# Patient Record
Sex: Male | Born: 2000 | Race: White | Hispanic: No | Marital: Single
Health system: Southern US, Community
[De-identification: ages and names within clinical notes are randomized; demographics above are authoritative.]

---
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
## 2023-12-10 ENCOUNTER — Emergency Department (HOSPITAL_COMMUNITY): Payer: BC Managed Care – PPO

## 2023-12-10 ENCOUNTER — Encounter (HOSPITAL_COMMUNITY): Payer: Self-pay

## 2023-12-10 ENCOUNTER — Other Ambulatory Visit: Payer: Self-pay

## 2023-12-10 ENCOUNTER — Emergency Department (HOSPITAL_COMMUNITY)
Admission: EM | Admit: 2023-12-10 | Discharge: 2023-12-10 | Disposition: A | Discharge status: Home / Self Care | Payer: BC Managed Care – PPO | Attending: Emergency Medicine | Admitting: Emergency Medicine

## 2023-12-10 DIAGNOSIS — R0789 Other chest pain: Secondary | ICD-10-CM | POA: Diagnosis present

## 2023-12-10 DIAGNOSIS — R0602 Shortness of breath: Secondary | ICD-10-CM | POA: Insufficient documentation

## 2023-12-10 DIAGNOSIS — F419 Anxiety disorder, unspecified: Secondary | ICD-10-CM | POA: Insufficient documentation

## 2023-12-10 DIAGNOSIS — B349 Viral infection, unspecified: Secondary | ICD-10-CM

## 2023-12-10 LAB — BASIC METABOLIC PANEL
Anion gap: 8 (ref 5–15)
BUN: 18 mg/dL (ref 6–20)
CO2: 25 mmol/L (ref 22–32)
Calcium: 9.4 mg/dL (ref 8.9–10.3)
Chloride: 102 mmol/L (ref 98–111)
Creatinine, Ser: 0.88 mg/dL (ref 0.61–1.24)
GFR, Estimated: >60 mL/min (ref >60)
Glucose, Bld: 109 mg/dL — ABNORMAL HIGH (ref 70–99)
Potassium: 3 mmol/L — ABNORMAL LOW (ref 3.5–5.1)
Sodium: 135 mmol/L (ref 135–145)

## 2023-12-10 LAB — CBC
HCT: 41.7 % (ref 39.0–52.0)
Hemoglobin: 14.4 g/dL (ref 13.0–17.0)
MCH: 33.9 pg (ref 26.0–34.0)
MCHC: 34.5 g/dL (ref 30.0–36.0)
MCV: 98.1 fL (ref 80.0–100.0)
Platelets: 233 10*3/uL (ref 150–400)
RBC: 4.25 MIL/uL (ref 4.22–5.81)
RDW: 11.3 % — ABNORMAL LOW (ref 11.5–15.5)
WBC: 7.5 10*3/uL (ref 4.0–10.5)
nRBC: 0 % (ref 0.0–0.2)

## 2023-12-10 LAB — TROPONIN I (HIGH SENSITIVITY)
Troponin I (High Sensitivity): 11 ng/L (ref <18)
Troponin I (High Sensitivity): 18 ng/L — ABNORMAL HIGH (ref <18)

## 2023-12-10 LAB — D-DIMER, QUANTITATIVE: D-Dimer, Quant: <0.27 ug{FEU}/mL (ref 0.00–0.50)

## 2023-12-10 MED ORDER — POTASSIUM CHLORIDE CRYS ER 20 MEQ PO TBCR
40.0000 meq | EXTENDED_RELEASE_TABLET | Freq: Once | ORAL | Status: AC
  Administered 2023-12-10: 40 meq via ORAL
  Filled 2023-12-10: qty 2

## 2023-12-10 MED ORDER — FAMOTIDINE IN NACL 20-0.9 MG/50ML-% IV SOLN
20.0000 mg | INTRAVENOUS | Status: AC
  Administered 2023-12-10: 20 mg via INTRAVENOUS
  Filled 2023-12-10: qty 50

## 2023-12-10 MED ORDER — ALUM & MAG HYDROXIDE-SIMETH 200-200-20 MG/5ML PO SUSP
15.0000 mL | Freq: Once | ORAL | Status: AC
  Administered 2023-12-10: 15 mL via ORAL
  Filled 2023-12-10: qty 30

## 2023-12-10 MED ORDER — KETOROLAC TROMETHAMINE 15 MG/ML IJ SOLN
15.0000 mg | Freq: Once | INTRAMUSCULAR | Status: AC
  Administered 2023-12-10: 15 mg via INTRAVENOUS
  Filled 2023-12-10: qty 1

## 2023-12-10 MED ORDER — ACETAMINOPHEN 500 MG PO TABS
1000.0000 mg | ORAL_TABLET | Freq: Once | ORAL | Status: AC
  Administered 2023-12-10: 1000 mg via ORAL
  Filled 2023-12-10: qty 2

## 2023-12-10 NOTE — ED Provider Notes (Signed)
 [place] EMERGENCY DEPARTMENT AT [facility] Provider Note   CSN: [id number] Arrival date & time: 12/10/23  0458     History  Chief Complaint  Patient presents with   Chest Pain    Mike Todd is a 23 y.o. male with PMHx anxiety who presents to ED concerned for intermittent chest pain, dizziness, SOB, belching over the past 1 week. Patient stating that chest pain is worse with inspiration and states that he currently has chest pain. Chest pain is central and anterior. Patient stating that his dizziness is chronic for him and that his vision dims when he stands up and has been happening to him since he was a child. Patient stating that his doctors assessed his heart years ago and it appears that the dizzy spells are d/t patient being tall. Patient also requesting water and stating that he has not taken his anxiety medication today.  Denies fever, cough, nausea, vomiting, diarrhea, dysuria, hematuria, hematochezia. Denies rhinorrhea, congestion, cough.     Chest Pain      Home Medications Prior to Admission medications   Medication Sig Start Date End Date Taking? Authorizing Provider  ondansetron  (ZOFRAN  ODT) 4 MG disintegrating tablet Take 1 tablet (4 mg total) by mouth every 8 (eight) hours as needed for nausea or vomiting. 04/29/15   Eluterio Fallow, PA-C      Allergies    Patient has no known allergies.    Review of Systems   Review of Systems  Cardiovascular:  Positive for chest pain.    Physical Exam Updated Vital Signs BP (!) 123/50   Pulse 90   Temp (!) 100.6 F (38.1 C) (Oral)   Resp 18   SpO2 99%  Physical Exam Vitals and nursing note reviewed.  Constitutional:      General: He is not in acute distress.    Appearance: He is not ill-appearing, toxic-appearing or diaphoretic.  HENT:     Head: Normocephalic and atraumatic.     Mouth/Throat:     Mouth: Mucous membranes are moist.     Pharynx: No oropharyngeal exudate or posterior  oropharyngeal erythema.  Eyes:     General: No scleral icterus.       Right eye: No discharge.        Left eye: No discharge.     Conjunctiva/sclera: Conjunctivae normal.  Cardiovascular:     Rate and Rhythm: Normal rate and regular rhythm.     Pulses: Normal pulses.          Radial pulses are 2+ on the right side and 2+ on the left side.     Heart sounds: Normal heart sounds. No murmur heard.    Comments: +2 radial pulses are symmetric Pulmonary:     Effort: Pulmonary effort is normal. No respiratory distress.     Breath sounds: Normal breath sounds. No wheezing, rhonchi or rales.  Abdominal:     General: Bowel sounds are normal.     Palpations: Abdomen is soft. There is no mass.     Tenderness: There is no abdominal tenderness.  Musculoskeletal:     Right lower leg: No edema.     Left lower leg: No edema.  Skin:    General: Skin is warm and dry.     Findings: No rash.  Neurological:     General: No focal deficit present.     Mental Status: He is alert and oriented to person, place, and time. Mental status is at baseline.  Psychiatric:  Mood and Affect: Mood is anxious.     ED Results / Procedures / Treatments   Labs (all labs ordered are listed, but only abnormal results are displayed) Labs Reviewed  BASIC METABOLIC PANEL - Abnormal; Notable for the following components:      Result Value   Potassium 3.0 (*)    Glucose, Bld 109 (*)    All other components within normal limits  CBC - Abnormal; Notable for the following components:   RDW 11.3 (*)    All other components within normal limits  TROPONIN I (HIGH SENSITIVITY) - Abnormal; Notable for the following components:   Troponin I (High Sensitivity) 18 (*)    All other components within normal limits  D-DIMER, QUANTITATIVE  TROPONIN I (HIGH SENSITIVITY)    EKG EKG Interpretation Date/Time:  Monday December 10 2023 05:07:27 EST Ventricular Rate:  117 PR Interval:  135 QRS Duration:  90 QT  Interval:  304 QTC Calculation: 425 R Axis:   254  Text Interpretation: Sinus tachycardia Consider right atrial enlargement Right superior axis Confirmed by Laurice Coy [phone]) on 12/10/2023 7:55:50 AM  Radiology DG Chest 2 View Result Date: 12/10/2023 CLINICAL DATA:  Chest pain EXAM: CHEST - 2 VIEW COMPARISON:  No comparison studies available. FINDINGS: The lungs are clear without focal pneumonia, edema, pneumothorax or pleural effusion. The cardiopericardial silhouette is within normal limits for size. Lucency anterior to the sternum on the lateral view is compatible with the degree of patient rotation. No acute bony abnormality. Telemetry leads overlie the chest. IMPRESSION: No active cardiopulmonary disease. Electronically Signed   By: Camellia Candle M.D.   On: 12/10/2023 05:52    Procedures Procedures    Medications Ordered in ED Medications  potassium chloride  SA (KLOR-CON  M) CR tablet 40 mEq (40 mEq Oral Given 12/10/23 0843)  alum & mag hydroxide-simeth (MAALOX/MYLANTA) 200-200-20 MG/5ML suspension 15 mL (15 mLs Oral Given 12/10/23 0842)  famotidine  (PEPCID ) IVPB 20 mg premix (0 mg Intravenous Stopped 12/10/23 0912)  acetaminophen  (TYLENOL ) tablet 1,000 mg (1,000 mg Oral Given 12/10/23 0940)  ketorolac  (TORADOL ) 15 MG/ML injection 15 mg (15 mg Intravenous Given 12/10/23 9057)    ED Course/ Medical Decision Making/ A&P                                 Medical Decision Making Amount and/or Complexity of Data Reviewed Labs: ordered. Radiology: ordered.  Risk OTC drugs. Prescription drug management.   This patient presents to the ED for concern of chest pain, this involves an extensive number of treatment options, and is a complaint that carries with it a high risk of complications and morbidity.  The differential diagnosis includes acute coronary syndrome, congestive heart failure, pericarditis, pneumonia, pulmonary embolism, tension pneumothorax, esophageal rupture, aortic dissection,  cardiac tamponade, musculoskeletal   Co morbidities that complicate the patient evaluation  anxiety   Additional history obtained:  It does not appear the patient follows with PCP.  Will refer to community clinic.   Problem List / ED Course / Critical interventions / Medication management  Patient presents to ED concerned for intermittent chest pain, dizziness, SOB, belching x1 week.  Denies any other infectious symptoms.  Triage vitals showing that patient is initially tachycardic with 109 BPM which resolved without medical management.  Patient also developed a mild fever in the ED at 100.81F. Rest of physical exam reassuring.  Patient afebrile with stable vitals. Patient ambulatory in ED  without complaint. I Ordered, and personally interpreted labs.  CBC without leukocytosis or anemia.  BMP with mild hypokalemia at 3.0.  Troponin with mild elevation but still within normal limits.  D-dimer negative. The patient was maintained on a cardiac monitor.  I personally viewed and interpreted the EKG/cardiac monitored which showed an underlying rhythm of: Sinus tachycardia. I ordered imaging studies including chest xray to assess for process contributing to patient's symptoms. I independently visualized and interpreted imaging which showed no acute cardiopulmonary disease. I agree with the radiologist interpretation Provided patient with GI cocktail given his belching - this did not relieve his chest pain. With patient's mild fever in the ED but otherwise reassuring workup, I am more concerned for a brewing viral illness and possible costochondritis. Provided patient with a dose of tylenol  and Toradol . Educated patient on alternating Ibuprofen and Tylenol  for pain control. recommended following up with PCP.  Patient verbalized understanding of plan. I have reviewed the patients home medicines and have made adjustments as needed Patient was given return precautions. Patient stable for discharge at this  time.  Patient verbalized understanding of plan.  Ddx:  These are considered less likely due to history of present illness and physical exam findings.  -Acute coronary syndrome: EKG and troponins within normal limits  -Congestive heart failure: physical exam reassuring -Pneumonia: lungs are clear to auscultation bilaterally -Pericarditis: EKG reassuring -Pneumothorax: lungs are clear to auscultation bilaterally -Esophageal rupture: patient denies vomiting -Aortic dissection: vital signs are stable, no variation in pulse pressure, chest pain is anterior and sternal. -Cardiac tamponade: absence of hypotension, JVD, and muffled heart sounds   Social Determinants of Health:  none         Final Clinical Impression(s) / ED Diagnoses Final diagnoses:  Atypical chest pain  Viral illness    Rx / DC Orders ED Discharge Orders     None         Hoy Nidia FALCON, [place] 12/10/23 1016    Patsey Lot, MD 12/10/23 1523

## 2023-12-10 NOTE — ED Triage Notes (Signed)
 Patient arrived with complaints of central chest pain, dizziness, and shortness of breath over the last few days.

## 2023-12-10 NOTE — Discharge Instructions (Addendum)

## 2023-12-14 ENCOUNTER — Telehealth: Payer: Self-pay

## 2023-12-14 ENCOUNTER — Ambulatory Visit
Admission: RE | Admit: 2023-12-14 | Discharge: 2023-12-14 | Disposition: A | Discharge status: Home / Self Care | Payer: BC Managed Care – PPO | Source: Ambulatory Visit | Attending: Family Medicine | Admitting: Family Medicine

## 2023-12-14 VITALS — BP 124/77 | HR 96 | Temp 98.6°F | Resp 16

## 2023-12-14 DIAGNOSIS — J029 Acute pharyngitis, unspecified: Secondary | ICD-10-CM

## 2023-12-14 LAB — POCT MONO SCREEN (KUC): Mono, POC: NEGATIVE

## 2023-12-14 LAB — POCT RAPID STREP A (OFFICE): Rapid Strep A Screen: NEGATIVE

## 2023-12-14 MED ORDER — AMOXICILLIN 500 MG PO CAPS: 500.0000 mg | ORAL_CAPSULE | Freq: Two times a day (BID) | ORAL | 0 refills | Status: DC

## 2023-12-14 MED ORDER — AMOXICILLIN 500 MG PO CAPS
500.0000 mg | ORAL_CAPSULE | Freq: Two times a day (BID) | ORAL | 0 refills | Status: AC
Start: 2023-12-14 — End: 2023-12-24

## 2023-12-14 NOTE — ED Provider Notes (Signed)
 [facility]    CSN: [id number] Arrival date & time: 12/14/23  1428      History   Chief Complaint Chief Complaint  Patient presents with   Sore Throat    Extremely sore throat with red bumps on roof of mouth. I think I've got strep throat. - Entered by patient    HPI Mike Todd is a 23 y.o. male  presents for evaluation of URI symptoms for 7 days. Patient reports associated symptoms of ST, tactile fevers, and body aches. Denies N/V/D, cough, congestion, ear pain, SOB. Patient does not have a hx of asthma. Patient is not an active smoker.   Reports no sick contacts.  Patient was seen in the ER on January 6 for atypical chest pain with negative workup.  Was diagnosed with viral illness.  Pt has taken nothing OTC for symptoms. Pt has no other concerns at this time.    Sore Throat    History reviewed. No pertinent past medical history.  There are no active problems to display for this patient.   History reviewed. No pertinent surgical history.     Home Medications    Prior to Admission medications   Medication Sig Start Date End Date Taking? Authorizing Provider  amoxicillin  (AMOXIL ) 500 MG capsule Take 1 capsule (500 mg total) by mouth 2 (two) times daily for 10 days. 12/14/23 12/24/23 Yes Marnette Perkins, Jodi R, NP  ondansetron  (ZOFRAN  ODT) 4 MG disintegrating tablet Take 1 tablet (4 mg total) by mouth every 8 (eight) hours as needed for nausea or vomiting. 04/29/15   Eluterio Fallow, PA-C    Family History History reviewed. No pertinent family history.  Social History Social History   Tobacco Use   Smoking status: Unknown     Allergies   Patient has no known allergies.   Review of Systems Review of Systems  Constitutional:  Positive for fever.  HENT:  Positive for sore throat.   Musculoskeletal:  Positive for myalgias.     Physical Exam Triage Vital Signs ED Triage Vitals  Encounter Vitals Group     BP 12/14/23 1441 124/77     Systolic BP  Percentile --      Diastolic BP Percentile --      Pulse Rate 12/14/23 1441 96     Resp 12/14/23 1441 16     Temp 12/14/23 1441 98.6 F (37 C)     Temp Source 12/14/23 1441 Oral     SpO2 12/14/23 1441 97 %     Weight --      Height --      Head Circumference --      Peak Flow --      Pain Score 12/14/23 1439 0     Pain Loc --      Pain Education --      Exclude from Growth Chart --    No data found.  Updated Vital Signs BP 124/77 (BP Location: Right Arm)   Pulse 96   Temp 98.6 F (37 C) (Oral)   Resp 16   SpO2 97%   Visual Acuity Right Eye Distance:   Left Eye Distance:   Bilateral Distance:    Right Eye Near:   Left Eye Near:    Bilateral Near:     Physical Exam Vitals and nursing note reviewed.  Constitutional:      General: He is not in acute distress.    Appearance: Normal appearance. He is not ill-appearing or toxic-appearing.  HENT:  Head: Normocephalic and atraumatic.     Right Ear: Tympanic membrane and ear canal normal.     Left Ear: Tympanic membrane and ear canal normal.     Nose: No congestion or rhinorrhea.     Mouth/Throat:     Mouth: Mucous membranes are moist.     Pharynx: Oropharynx is clear. Uvula midline. Posterior oropharyngeal erythema present. No pharyngeal swelling, oropharyngeal exudate or uvula swelling.     Tonsils: No tonsillar exudate.     Comments: Petechia noted   Eyes:     Pupils: Pupils are equal, round, and reactive to light.  Cardiovascular:     Rate and Rhythm: Normal rate and regular rhythm.     Heart sounds: Normal heart sounds.  Pulmonary:     Effort: Pulmonary effort is normal.     Breath sounds: Normal breath sounds.  Musculoskeletal:     Cervical back: Normal range of motion and neck supple.  Lymphadenopathy:     Cervical: No cervical adenopathy.  Skin:    General: Skin is warm and dry.  Neurological:     General: No focal deficit present.     Mental Status: He is alert and oriented to person, place, and  time.  Psychiatric:        Mood and Affect: Mood normal.        Behavior: Behavior normal.      UC Treatments / Results  Labs (all labs ordered are listed, but only abnormal results are displayed) Labs Reviewed  CULTURE, GROUP A STREP [facility])  POCT RAPID STREP A (OFFICE)  POCT MONO SCREEN ([facility])    EKG   Radiology No results found.  Procedures Procedures (including critical care time)  Medications Ordered in UC Medications - No data to display  Initial Impression / Assessment and Plan / UC Course  I have reviewed the triage vital signs and the nursing notes.  Pertinent labs & imaging results that were available during my care of the patient were reviewed by me and considered in my medical decision making (see chart for details).     Neg rapid strep, will culture. Neg POCT mono. Given LOS will start amoxil . Salt water gargles and warm liquids. PCP follow up if symptoms do not improve. ER precautions reviewed.  Final Clinical Impressions(s) / UC Diagnoses   Final diagnoses:  Sore throat  Acute pharyngitis, unspecified etiology     Discharge Instructions      Start Amoxil  twice daily for 10 days. Salt water gargles and warm liquids. Lots of rest and fluids. Please follow up with your PCP if your symptoms do not improve. Please go to the ER for any worsening symptoms. I hope you feel better soon!     ED Prescriptions     Medication Sig Dispense Auth. Provider   amoxicillin  (AMOXIL ) 500 MG capsule Take 1 capsule (500 mg total) by mouth 2 (two) times daily for 10 days. 20 capsule Townsend Cudworth, Jodi R, NP      PDMP not reviewed this encounter.   Loreda Myla SAUNDERS, NP 12/14/23 [phone]

## 2023-12-14 NOTE — ED Triage Notes (Signed)
 Pt presents to UC for c/o sore throat x1 week.

## 2023-12-14 NOTE — Discharge Instructions (Signed)
 Start Amoxil twice daily for 10 days. Salt water gargles and warm liquids. Lots of rest and fluids. Please follow up with your PCP if your symptoms do not improve. Please go to the ER for any worsening symptoms. I hope you feel better soon!

## 2023-12-17 LAB — CULTURE, GROUP A STREP (THRC)

## 2024-10-18 ENCOUNTER — Other Ambulatory Visit: Payer: Self-pay

## 2024-10-18 ENCOUNTER — Encounter (HOSPITAL_COMMUNITY): Payer: Self-pay

## 2024-10-18 ENCOUNTER — Emergency Department (HOSPITAL_COMMUNITY)

## 2024-10-18 ENCOUNTER — Observation Stay (HOSPITAL_COMMUNITY)
Admission: EM | Admit: 2024-10-18 | Discharge: 2024-10-19 | Disposition: A | Attending: Emergency Medicine | Admitting: Emergency Medicine

## 2024-10-18 DIAGNOSIS — K353 Acute appendicitis with localized peritonitis, without perforation or gangrene: Secondary | ICD-10-CM | POA: Diagnosis not present

## 2024-10-18 DIAGNOSIS — R1031 Right lower quadrant pain: Secondary | ICD-10-CM | POA: Diagnosis present

## 2024-10-18 DIAGNOSIS — F17200 Nicotine dependence, unspecified, uncomplicated: Secondary | ICD-10-CM | POA: Insufficient documentation

## 2024-10-18 DIAGNOSIS — K3580 Unspecified acute appendicitis: Secondary | ICD-10-CM | POA: Diagnosis present

## 2024-10-18 LAB — URINALYSIS, ROUTINE W REFLEX MICROSCOPIC
Bilirubin Urine: NEGATIVE
Glucose, UA: NEGATIVE mg/dL
Hgb urine dipstick: NEGATIVE
Ketones, ur: NEGATIVE mg/dL
Leukocytes,Ua: NEGATIVE
Nitrite: NEGATIVE
Protein, ur: NEGATIVE mg/dL
Specific Gravity, Urine: 1.016 (ref 1.005–1.030)
pH: 6 (ref 5.0–8.0)

## 2024-10-18 LAB — COMPREHENSIVE METABOLIC PANEL WITH GFR
ALT: 15 U/L (ref 0–44)
AST: 20 U/L (ref 15–41)
Albumin: 4.7 g/dL (ref 3.5–5.0)
Alkaline Phosphatase: 66 U/L (ref 38–126)
Anion gap: 12 (ref 5–15)
BUN: 17 mg/dL (ref 6–20)
CO2: 25 mmol/L (ref 22–32)
Calcium: 10 mg/dL (ref 8.9–10.3)
Chloride: 102 mmol/L (ref 98–111)
Creatinine, Ser: 0.86 mg/dL (ref 0.61–1.24)
GFR, Estimated: >60 mL/min (ref >60)
Glucose, Bld: 129 mg/dL — ABNORMAL HIGH (ref 70–99)
Potassium: 3.4 mmol/L — ABNORMAL LOW (ref 3.5–5.1)
Sodium: 139 mmol/L (ref 135–145)
Total Bilirubin: 0.4 mg/dL (ref 0.0–1.2)
Total Protein: 7.7 g/dL (ref 6.5–8.1)

## 2024-10-18 LAB — LIPASE, BLOOD: Lipase: 23 U/L (ref 11–51)

## 2024-10-18 LAB — CBC
HCT: 39.2 % (ref 39.0–52.0)
Hemoglobin: 13.8 g/dL (ref 13.0–17.0)
MCH: 33.5 pg (ref 26.0–34.0)
MCHC: 35.2 g/dL (ref 30.0–36.0)
MCV: 95.1 fL (ref 80.0–100.0)
Platelets: 249 K/uL (ref 150–400)
RBC: 4.12 MIL/uL — ABNORMAL LOW (ref 4.22–5.81)
RDW: 11.5 % (ref 11.5–15.5)
WBC: 11 K/uL — ABNORMAL HIGH (ref 4.0–10.5)
nRBC: 0 % (ref 0.0–0.2)

## 2024-10-18 MED ORDER — IOHEXOL 300 MG/ML  SOLN
100.0000 mL | Freq: Once | INTRAMUSCULAR | Status: AC | PRN
  Administered 2024-10-18: 100 mL via INTRAVENOUS

## 2024-10-18 NOTE — ED Triage Notes (Signed)
 Pt reports RLQ abdominal pain onset two nights ago associated with nausea and vomiting yesterday.

## 2024-10-18 NOTE — ED Provider Notes (Signed)
 [place] EMERGENCY DEPARTMENT AT [facility] Provider Note   CSN: [id number] Arrival date & time: 10/18/24  1944     Patient presents with: Abdominal Pain   Mike Todd is a 23 y.o. male.  With no significant past medical history presents to the ED for abdominal pain.  Patient reports diffuse abdominal pain that woke him from sleep 2 nights ago.  Pain has since migrated to the right lower quadrant and has persisted.  Associated nausea with a couple episodes of vomiting yesterday.  No changes in bowel habits fevers chills or other complaints at this time.  No prior history of abdominal surgeries.    Abdominal Pain      Prior to Admission medications   Medication Sig Start Date End Date Taking? Authorizing Provider  ondansetron  (ZOFRAN  ODT) 4 MG disintegrating tablet Take 1 tablet (4 mg total) by mouth every 8 (eight) hours as needed for nausea or vomiting. 04/29/15   Dansie, William, PA-C    Allergies: Patient has no known allergies.    Review of Systems  Gastrointestinal:  Positive for abdominal pain.    Updated Vital Signs BP 134/77   Pulse (!) 103   Temp 98.4 F (36.9 C) (Oral)   Resp 16   Ht 6' 4 (1.93 m)   Wt 68 kg   SpO2 97%   BMI 18.26 kg/m   Physical Exam Vitals and nursing note reviewed.  HENT:     Head: Normocephalic and atraumatic.  Eyes:     Pupils: Pupils are equal, round, and reactive to light.  Cardiovascular:     Rate and Rhythm: Normal rate and regular rhythm.  Pulmonary:     Effort: Pulmonary effort is normal.     Breath sounds: Normal breath sounds.  Abdominal:     Palpations: Abdomen is soft.     Tenderness: There is abdominal tenderness in the right lower quadrant. There is no guarding or rebound. Positive signs include McBurney's sign.  Skin:    General: Skin is warm and dry.  Neurological:     Mental Status: He is alert.  Psychiatric:        Mood and Affect: Mood normal.     (all labs ordered are listed, but only  abnormal results are displayed) Labs Reviewed  COMPREHENSIVE METABOLIC PANEL WITH GFR - Abnormal; Notable for the following components:      Result Value   Potassium 3.4 (*)    Glucose, Bld 129 (*)    All other components within normal limits  CBC - Abnormal; Notable for the following components:   WBC 11.0 (*)    RBC 4.12 (*)    All other components within normal limits  LIPASE, BLOOD  URINALYSIS, ROUTINE W REFLEX MICROSCOPIC    EKG: None  Radiology: No results found.   Procedures   Medications Ordered in the ED  iohexol (OMNIPAQUE) 300 MG/ML solution 100 mL (has no administration in time range)    Clinical Course as of 10/18/24 2344  Sat Oct 18, 2024  2325 I, Ozell Marine DO, am transitioning care of this patient to the oncoming provider pending CT abdomen pelvis for rule out appendicitis, reevaluation and disposition [MP]    Clinical Course User Index [MP] Marine Ozell LABOR, DO                                 Medical Decision Making 23 year old male with history as  above presented to the ED for 2 days of abdominal pain initially generalized then migrating to the right lower quadrant.  Right lower quadrant tenderness McBurney point tenderness on exam.  Labs notable for leukocytosis.  Presentation most concerning for acute appendicitis will need CT abdomen pelvis to evaluate.  Pain well-controlled at this time  Amount and/or Complexity of Data Reviewed Labs: ordered. Radiology: ordered.  Risk Prescription drug management.        Final diagnoses:  Right lower quadrant pain    ED Discharge Orders     None          Pamella Ozell LABOR, DO 10/18/24 2344

## 2024-10-19 ENCOUNTER — Encounter (HOSPITAL_COMMUNITY): Payer: Self-pay | Admitting: Surgery

## 2024-10-19 ENCOUNTER — Encounter (HOSPITAL_COMMUNITY)
Admission: EM | Disposition: A | Discharge status: Home / Self Care | Payer: Self-pay | Source: Home / Self Care | Attending: Emergency Medicine

## 2024-10-19 ENCOUNTER — Observation Stay (HOSPITAL_COMMUNITY): Admitting: Anesthesiology

## 2024-10-19 DIAGNOSIS — K3580 Unspecified acute appendicitis: Secondary | ICD-10-CM | POA: Diagnosis present

## 2024-10-19 HISTORY — PX: LAPAROSCOPIC APPENDECTOMY: SHX408

## 2024-10-19 SURGERY — APPENDECTOMY, LAPAROSCOPIC
Anesthesia: General | Site: Abdomen

## 2024-10-19 MED ORDER — BUPIVACAINE-EPINEPHRINE (PF) 0.25% -1:200000 IJ SOLN
INTRAMUSCULAR | Status: DC | PRN
  Administered 2024-10-19: 10 mL

## 2024-10-19 MED ORDER — FENTANYL CITRATE (PF) 250 MCG/5ML IJ SOLN
INTRAMUSCULAR | Status: DC | PRN
  Administered 2024-10-19: 100 ug via INTRAVENOUS
  Administered 2024-10-19: 50 ug via INTRAVENOUS

## 2024-10-19 MED ORDER — ONDANSETRON HCL 4 MG/2ML IJ SOLN
INTRAMUSCULAR | Status: AC
  Filled 2024-10-19: qty 2

## 2024-10-19 MED ORDER — MIDAZOLAM HCL (PF) 2 MG/2ML IJ SOLN
INTRAMUSCULAR | Status: DC | PRN
  Administered 2024-10-19: 2 mg via INTRAVENOUS

## 2024-10-19 MED ORDER — KETOROLAC TROMETHAMINE 30 MG/ML IJ SOLN
INTRAMUSCULAR | Status: DC | PRN
  Administered 2024-10-19: 30 mg via INTRAVENOUS

## 2024-10-19 MED ORDER — CEFOTETAN DISODIUM 2 G IJ SOLR
INTRAMUSCULAR | Status: AC
  Filled 2024-10-19: qty 2

## 2024-10-19 MED ORDER — OXYCODONE HCL 5 MG/5ML PO SOLN: 5.0000 mg | Freq: Once | ORAL | Status: DC | PRN

## 2024-10-19 MED ORDER — MORPHINE SULFATE (PF) 4 MG/ML IV SOLN: 4.0000 mg | INTRAVENOUS | Status: DC | PRN

## 2024-10-19 MED ORDER — SODIUM CHLORIDE 0.9 % IV SOLN
2.0000 g | Freq: Once | INTRAVENOUS | Status: AC
  Administered 2024-10-19: 2 g via INTRAVENOUS
  Filled 2024-10-19: qty 2

## 2024-10-19 MED ORDER — DIPHENHYDRAMINE HCL 25 MG PO CAPS: 25.0000 mg | ORAL_CAPSULE | Freq: Four times a day (QID) | ORAL | Status: DC | PRN

## 2024-10-19 MED ORDER — MIDAZOLAM HCL (PF) 2 MG/2ML IJ SOLN: 0.5000 mg | Freq: Once | INTRAMUSCULAR | Status: DC | PRN

## 2024-10-19 MED ORDER — ACETAMINOPHEN 10 MG/ML IV SOLN
INTRAVENOUS | Status: AC
  Filled 2024-10-19: qty 100

## 2024-10-19 MED ORDER — SUGAMMADEX SODIUM 200 MG/2ML IV SOLN
INTRAVENOUS | Status: DC | PRN
Start: 2024-10-19 — End: 2024-10-19
  Administered 2024-10-19: 200 mg via INTRAVENOUS

## 2024-10-19 MED ORDER — BUPIVACAINE-EPINEPHRINE (PF) 0.25% -1:200000 IJ SOLN
INTRAMUSCULAR | Status: AC
  Filled 2024-10-19: qty 30

## 2024-10-19 MED ORDER — HYDROMORPHONE HCL 1 MG/ML IJ SOLN: 0.2500 mg | INTRAMUSCULAR | Status: DC | PRN

## 2024-10-19 MED ORDER — ACETAMINOPHEN 325 MG PO TABS
650.0000 mg | ORAL_TABLET | Freq: Four times a day (QID) | ORAL | Status: DC | PRN
Start: 2024-10-19 — End: 2024-10-19

## 2024-10-19 MED ORDER — ONDANSETRON HCL 4 MG/2ML IJ SOLN
INTRAMUSCULAR | Status: AC
Start: 2024-10-19 — End: 2024-10-19
  Filled 2024-10-19: qty 2

## 2024-10-19 MED ORDER — PROPOFOL 10 MG/ML IV BOLUS
INTRAVENOUS | Status: DC | PRN
Start: 2024-10-19 — End: 2024-10-19
  Administered 2024-10-19: 200 mg via INTRAVENOUS

## 2024-10-19 MED ORDER — OXYCODONE HCL 5 MG PO TABS: 5.0000 mg | ORAL_TABLET | Freq: Once | ORAL | Status: DC | PRN

## 2024-10-19 MED ORDER — LACTATED RINGERS IR SOLN
Status: DC | PRN
  Administered 2024-10-19: 1000 mL

## 2024-10-19 MED ORDER — DIPHENHYDRAMINE HCL 50 MG/ML IJ SOLN: 25.0000 mg | Freq: Four times a day (QID) | INTRAMUSCULAR | Status: DC | PRN

## 2024-10-19 MED ORDER — PIPERACILLIN-TAZOBACTAM 3.375 G IVPB 30 MIN
3.3750 g | Freq: Once | INTRAVENOUS | Status: AC
  Administered 2024-10-19: 3.375 g via INTRAVENOUS
  Filled 2024-10-19: qty 50

## 2024-10-19 MED ORDER — ONDANSETRON HCL 4 MG/2ML IJ SOLN: 4.0000 mg | Freq: Four times a day (QID) | INTRAMUSCULAR | Status: DC | PRN

## 2024-10-19 MED ORDER — OXYCODONE HCL 5 MG PO TABS: 5.0000 mg | ORAL_TABLET | Freq: Four times a day (QID) | ORAL | 0 refills | Status: AC | PRN

## 2024-10-19 MED ORDER — ONDANSETRON 4 MG PO TBDP: 4.0000 mg | ORAL_TABLET | Freq: Four times a day (QID) | ORAL | Status: DC | PRN

## 2024-10-19 MED ORDER — MEPERIDINE HCL 25 MG/ML IJ SOLN: 6.2500 mg | INTRAMUSCULAR | Status: DC | PRN

## 2024-10-19 MED ORDER — ACETAMINOPHEN 10 MG/ML IV SOLN
INTRAVENOUS | Status: DC | PRN
  Administered 2024-10-19: 1000 mg via INTRAVENOUS

## 2024-10-19 MED ORDER — SUCCINYLCHOLINE CHLORIDE 200 MG/10ML IV SOSY
PREFILLED_SYRINGE | INTRAVENOUS | Status: AC
  Filled 2024-10-19: qty 10

## 2024-10-19 MED ORDER — FENTANYL CITRATE (PF) 250 MCG/5ML IJ SOLN
INTRAMUSCULAR | Status: AC
  Filled 2024-10-19: qty 5

## 2024-10-19 MED ORDER — ROCURONIUM BROMIDE 10 MG/ML (PF) SYRINGE
PREFILLED_SYRINGE | INTRAVENOUS | Status: DC | PRN
  Administered 2024-10-19: 50 mg via INTRAVENOUS

## 2024-10-19 MED ORDER — SUCCINYLCHOLINE CHLORIDE 200 MG/10ML IV SOSY
PREFILLED_SYRINGE | INTRAVENOUS | Status: DC | PRN
Start: 2024-10-19 — End: 2024-10-19
  Administered 2024-10-19: 160 mg via INTRAVENOUS

## 2024-10-19 MED ORDER — DEXAMETHASONE SOD PHOSPHATE PF 10 MG/ML IJ SOLN
INTRAMUSCULAR | Status: DC | PRN
  Administered 2024-10-19: 10 mg via INTRAVENOUS

## 2024-10-19 MED ORDER — ONDANSETRON HCL 4 MG/2ML IJ SOLN
INTRAMUSCULAR | Status: DC | PRN
  Administered 2024-10-19: 4 mg via INTRAVENOUS

## 2024-10-19 MED ORDER — LIDOCAINE HCL (PF) 2 % IJ SOLN
INTRAMUSCULAR | Status: AC
  Filled 2024-10-19: qty 5

## 2024-10-19 MED ORDER — PROPOFOL 10 MG/ML IV BOLUS
INTRAVENOUS | Status: AC
Start: 2024-10-19 — End: 2024-10-19
  Filled 2024-10-19: qty 20

## 2024-10-19 MED ORDER — MIDAZOLAM HCL 2 MG/2ML IJ SOLN
INTRAMUSCULAR | Status: AC
  Filled 2024-10-19: qty 2

## 2024-10-19 MED ORDER — OXYCODONE HCL 5 MG PO TABS
5.0000 mg | ORAL_TABLET | ORAL | Status: DC | PRN
  Administered 2024-10-19: 5 mg via ORAL
  Filled 2024-10-19: qty 1

## 2024-10-19 MED ORDER — KETOROLAC TROMETHAMINE 30 MG/ML IJ SOLN
INTRAMUSCULAR | Status: AC
  Filled 2024-10-19: qty 1

## 2024-10-19 MED ORDER — LIDOCAINE HCL (CARDIAC) PF 100 MG/5ML IV SOSY
PREFILLED_SYRINGE | INTRAVENOUS | Status: DC | PRN
Start: 2024-10-19 — End: 2024-10-19
  Administered 2024-10-19: 60 mg via INTRAVENOUS

## 2024-10-19 MED ORDER — LACTATED RINGERS IV SOLN: INTRAVENOUS | Status: DC

## 2024-10-19 MED ORDER — ROCURONIUM BROMIDE 10 MG/ML (PF) SYRINGE
PREFILLED_SYRINGE | INTRAVENOUS | Status: AC
  Filled 2024-10-19: qty 10

## 2024-10-19 SURGICAL SUPPLY — 37 items
BAG COUNTER SPONGE SURGICOUNT (BAG) IMPLANT
BENZOIN TINCTURE PRP APPL 2/3 (GAUZE/BANDAGES/DRESSINGS) IMPLANT
CLIP APPLIE 5 13 M/L LIGAMAX5 (MISCELLANEOUS) IMPLANT
CLIP APPLIE ROT 10 11.4 M/L (STAPLE) IMPLANT
CLSR STERI-STRIP ANTIMIC 1/2X4 (GAUZE/BANDAGES/DRESSINGS) IMPLANT
COVER SURGICAL LIGHT HANDLE (MISCELLANEOUS) ×1 IMPLANT
CUTTER ECHEON FLEX ENDO 45 340 (ENDOMECHANICALS) IMPLANT
DRSG TEGADERM 2-3/8X2-3/4 SM (GAUZE/BANDAGES/DRESSINGS) IMPLANT
ELECT REM PT RETURN 15FT ADLT (MISCELLANEOUS) ×1 IMPLANT
GAUZE SPONGE 2X2 8PLY STRL LF (GAUZE/BANDAGES/DRESSINGS) IMPLANT
GLOVE BIO SURGEON STRL SZ7 (GLOVE) ×1 IMPLANT
GLOVE BIOGEL PI IND STRL 7.0 (GLOVE) ×1 IMPLANT
GLOVE BIOGEL PI IND STRL 7.5 (GLOVE) ×1 IMPLANT
GOWN STRL REUS W/ TWL LRG LVL3 (GOWN DISPOSABLE) ×2 IMPLANT
IRRIGATION SUCT STRKRFLW 2 WTP (MISCELLANEOUS) ×1 IMPLANT
KIT BASIN OR (CUSTOM PROCEDURE TRAY) ×1 IMPLANT
KIT TURNOVER KIT A (KITS) ×1 IMPLANT
NS IRRIG 1000ML POUR BTL (IV SOLUTION) ×1 IMPLANT
PENCIL SMOKE EVACUATOR (MISCELLANEOUS) IMPLANT
RELOAD STAPLE 45 2.6 WHT THIN (STAPLE) IMPLANT
RELOAD STAPLE 45 3.6 BLU REG (STAPLE) IMPLANT
SCISSORS LAP 5X35 DISP (ENDOMECHANICALS) ×1 IMPLANT
SET TUBE SMOKE EVAC HIGH FLOW (TUBING) ×1 IMPLANT
SHEARS HARMONIC 36 ACE (MISCELLANEOUS) IMPLANT
SLEEVE ADV FIXATION 5X100MM (TROCAR) IMPLANT
SLEEVE Z-THREAD 5X100MM (TROCAR) ×1 IMPLANT
SPIKE FLUID TRANSFER (MISCELLANEOUS) ×1 IMPLANT
STRIP CLOSURE SKIN 1/2X4 (GAUZE/BANDAGES/DRESSINGS) ×1 IMPLANT
SUT MNCRL AB 4-0 PS2 18 (SUTURE) ×1 IMPLANT
SUT VICRYL 0 ENDOLOOP (SUTURE) IMPLANT
SUT VICRYL 0 UR6 27IN ABS (SUTURE) ×1 IMPLANT
SYSTEM BAG RETRIEVAL 10MM (BASKET) ×1 IMPLANT
TOWEL OR DSP ST BLU DLX 10/PK (DISPOSABLE) ×1 IMPLANT
TRAY FOLEY MTR SLVR 16FR STAT (SET/KITS/TRAYS/PACK) IMPLANT
TRAY LAPAROSCOPIC (CUSTOM PROCEDURE TRAY) ×1 IMPLANT
TROCAR BALLN 12MMX100 BLUNT (TROCAR) ×1 IMPLANT
TROCAR Z-THREAD OPTICAL 5X100M (TROCAR) ×1 IMPLANT

## 2024-10-19 NOTE — Anesthesia Preprocedure Evaluation (Addendum)
 Anesthesia Evaluation  Patient identified by MRN, date of birth, ID band Patient awake    Reviewed: Allergy & Precautions, NPO status , Patient's Chart, lab work & pertinent test results  History of Anesthesia Complications Negative for: history of anesthetic complications  Airway Mallampati: I  TM Distance: >3 FB Neck ROM: Full    Dental  (+) Dental Advisory Given, Teeth Intact   Pulmonary Current Smoker and Patient abstained from smoking.   breath sounds clear to auscultation       Cardiovascular negative cardio ROS  Rhythm:Regular Rate:Normal     Neuro/Psych negative neurological ROS     GI/Hepatic Neg liver ROS,,,N/V with acute appy   Endo/Other  negative endocrine ROS    Renal/GU negative Renal ROS     Musculoskeletal   Abdominal   Peds  Hematology Hb 13.8, plt 249k   Anesthesia Other Findings   Reproductive/Obstetrics                              Anesthesia Physical Anesthesia Plan  ASA: 2  Anesthesia Plan: General   Post-op Pain Management: Ofirmev  IV (intra-op)*   Induction: Intravenous and Rapid sequence  PONV Risk Score and Plan: 2 and Ondansetron  and Dexamethasone  Airway Management Planned: Oral ETT  Additional Equipment: None  Intra-op Plan:   Post-operative Plan: Extubation in OR  Informed Consent: I have reviewed the patients History and Physical, chart, labs and discussed the procedure including the risks, benefits and alternatives for the proposed anesthesia with the patient or authorized representative who has indicated his/her understanding and acceptance.     Dental advisory given  Plan Discussed with: CRNA and Surgeon  Anesthesia Plan Comments:          Anesthesia Quick Evaluation

## 2024-10-19 NOTE — Transfer of Care (Signed)
 Immediate Anesthesia Transfer of Care Note  Patient: Mike Todd  Procedure(s) Performed: APPENDECTOMY, LAPAROSCOPIC (Abdomen)  Patient Location: PACU  Anesthesia Type:General  Level of Consciousness: awake, alert , oriented, and patient cooperative  Airway & Oxygen Therapy: Patient Spontanous Breathing and Patient connected to face mask oxygen  Post-op Assessment: Report given to RN and Post -op Vital signs reviewed and stable  Post vital signs: Reviewed and stable  Last Vitals:  Vitals Value Taken Time  BP 116/62 10/19/24 11:03  Temp 36.6 C 10/19/24 11:03  Pulse 92 10/19/24 11:04  Resp 12 10/19/24 11:04  SpO2 100 % 10/19/24 11:04  Vitals shown include unfiled device data.  Last Pain:  Vitals:   10/19/24 1103  TempSrc:   PainSc: 0-No pain         Complications: No notable events documented.

## 2024-10-19 NOTE — Progress Notes (Signed)
Nurse reviewed discharge instructions with pt.  Pt verbalized understanding of discharge instructions, follow up appointments and new medications.  No concerns at time of discharge. 

## 2024-10-19 NOTE — Discharge Instructions (Signed)
 [facility] ______[facility] LAPAROSCOPIC SURGERY: POST OP INSTRUCTIONS Always review your discharge instruction sheet given to you by the facility where your surgery was performed. IF YOU HAVE DISABILITY OR FAMILY LEAVE FORMS, YOU MUST BRING THEM TO THE OFFICE FOR PROCESSING.   DO NOT GIVE THEM TO YOUR DOCTOR.  A prescription for pain medication may be given to you upon discharge.  Take your pain medication as prescribed, if needed.  If narcotic pain medicine is not needed, then you may take acetaminophen  (Tylenol ) or ibuprofen  (Advil ) as needed. Take your usually prescribed medications unless otherwise directed. If you need a refill on your pain medication, please contact your pharmacy.  They will contact our office to request authorization. Prescriptions will not be filled after 5pm or on week-ends. You should follow a light diet the first few days after arrival home, such as soup and crackers, etc.  Be sure to include lots of fluids daily. Most patients will experience some swelling and bruising in the area of the incisions.  Ice packs will help.  Swelling and bruising can take several days to resolve.  It is common to experience some constipation if taking pain medication after surgery.  Increasing fluid intake and taking a stool softener (such as Colace) will usually help or prevent this problem from occurring.  A mild laxative (Milk of Magnesia or Miralax) should be taken according to package instructions if there are no bowel movements after 48 hours. Unless discharge instructions indicate otherwise, you may remove your bandages 24-48 hours after surgery, and you may shower at that time.  You may have steri-strips (small skin tapes) in place directly over the incision.  These strips should be left on the skin for 7-10 days.  If your surgeon used skin glue on the incision, you may shower in 24 hours.  The glue will flake off over the next 2-3 weeks.  Any sutures or staples will be  removed at the office during your follow-up visit. ACTIVITIES:  You may resume regular (light) daily activities beginning the next day--such as daily self-care, walking, climbing stairs--gradually increasing activities as tolerated.  You may have sexual intercourse when it is comfortable.  Refrain from any heavy lifting or straining until approved by your doctor. You may drive when you are no longer taking prescription pain medication, you can comfortably wear a seatbelt, and you can safely maneuver your car and apply brakes. RETURN TO WORK:  __________________________________________________________ Mike Todd should see your doctor in the office for a follow-up appointment approximately 2-3 weeks after your surgery.  Make sure that you call for this appointment within a day or two after you arrive home to insure a convenient appointment time. OTHER INSTRUCTIONS: __________________________________________________________________________________________________________________________ __________________________________________________________________________________________________________________________ WHEN TO CALL YOUR DOCTOR: Fever over 101.0 Inability to urinate Continued bleeding from incision. Increased pain, redness, or drainage from the incision. Increasing abdominal pain  The clinic staff is available to answer your questions during regular business hours.  Please don't hesitate to call and ask to speak to one of the nurses for clinical concerns.  If you have a medical emergency, go to the nearest emergency room or call 911.  A surgeon from [facility] is always on call at the hospital. [address], [address], [place], [place]  [address] ? [address] [address], [place], [place]   [address] [phone] ? [phone] ? FAX [phone] Web site: [email or web address]

## 2024-10-19 NOTE — Anesthesia Procedure Notes (Signed)
 Procedure Name: Intubation Date/Time: 10/19/2024 10:16 AM  Performed by: Dasie Nena PARAS, CRNAPre-anesthesia Checklist: Patient identified, Emergency Drugs available, Suction available, Patient being monitored and Timeout performed Patient Re-evaluated:Patient Re-evaluated prior to induction Oxygen Delivery Method: Circle system utilized Preoxygenation: Pre-oxygenation with 100% oxygen Induction Type: IV induction, Rapid sequence and Cricoid Pressure applied Laryngoscope Size: Miller and 3 Grade View: Grade I Tube type: Oral Tube size: 7.5 mm Number of attempts: 1 Airway Equipment and Method: Stylet Placement Confirmation: ETT inserted through vocal cords under direct vision, positive ETCO2 and breath sounds checked- equal and bilateral Secured at: 24 cm Tube secured with: Tape Dental Injury: Teeth and Oropharynx as per pre-operative assessment

## 2024-10-19 NOTE — Op Note (Signed)
 Appendectomy, Lap, Procedure Note  Indications: The patient presented with a history of right-sided abdominal pain. A CT scan revealed findings consistent with acute appendicitis.  Pre-operative Diagnosis: Acute appendicitis without mention of peritonitis  Post-operative Diagnosis: Same  Surgeon: Donnice MARLA Lima   Assistants: none  Anesthesia: General endotracheal anesthesia  ASA Class: 1  Procedure Details  The patient was seen again in the Holding Room. The risks, benefits, complications, treatment options, and expected outcomes were discussed with the patient and/or family. The possibilities of reaction to medication, perforation of viscus, bleeding, recurrent infection, finding a normal appendix, the need for additional procedures, failure to diagnose a condition, and creating a complication requiring transfusion or operation were discussed. There was concurrence with the proposed plan and informed consent was obtained. The site of surgery was properly noted. The patient was taken to Operating Room, identified as Mike Todd and the procedure verified as Appendectomy. A Time Out was held and the above information confirmed.  The patient was placed in the supine position and general anesthesia was induced.  The abdomen was prepped and draped in a sterile fashion. A one centimeter supraumbilical incision was made.  Dissection was carried down to the fascia bluntly.  The fascia was incised vertically.  We entered the peritoneal cavity bluntly.  A pursestring suture was passed around the fascial opening with a 0 Vicryl.  The Hasson cannula was introduced into the abdomen and the tails of the suture were used to hold the Hasson in place.   The pneumoperitoneum was then established maintaining a maximum pressure of 15 mmHg.  Additional 5 mm cannulas then placed in the left lower quadrant of the abdomen and the epigastrium under direct visualization. A careful evaluation of the entire abdomen  was carried out. The patient was placed in Trendelenburg and left lateral decubitus position.  The scope was moved to the right upper quadrant port site. The cecum was mobilized medially.  The appendix was in a retrocecal location.  It appeared inflamed, but there was no sign of perforation or abscess. The appendix was carefully dissected. The appendix was skeletonized with the harmonic scalpel.   The appendix was divided at its base using an endo-GIA stapler. No appendiceal stump was left in place. There was no evidence of bleeding, leakage, or complication after division of the appendix. Irrigation was also performed and irrigate suctioned from the abdomen as well.  The umbilical port site was closed with the purse string suture. There was no residual palpable fascial defect.  The trocar site skin wounds were closed with 4-0 Monocryl.  Instrument, sponge, and needle counts were correct at the conclusion of the case.   Findings: The appendix was found to be inflamed. There were not signs of necrosis.  There was not perforation. There was not abscess formation.  Estimated Blood Loss:  Minimal         Drains: none         Specimens: appendix         Complications:  None; patient tolerated the procedure well.         Disposition: PACU - hemodynamically stable.         Condition: stable  Donnice MARLA. Lima, MD, [facility]  General Surgery   10/19/2024 10:55 AM

## 2024-10-19 NOTE — Plan of Care (Signed)
 ?  Problem: Clinical Measurements: ?Goal: Will remain free from infection ?Outcome: Progressing ?  ?

## 2024-10-19 NOTE — Progress Notes (Signed)
   10/19/24 1222  TOC Brief Assessment  Insurance and Status Reviewed  Patient has primary care physician No  Home environment has been reviewed Apartment  Prior level of function: Independent  Prior/Current Home Services No current home services  Social Drivers of Health Review SDOH reviewed no interventions necessary  Readmission risk has been reviewed Yes  Transition of care needs no transition of care needs at this time    Signed: Heather Saltness, MSW, LCSW Clinical Social Worker Inpatient Care Management 10/19/2024 12:23 PM

## 2024-10-19 NOTE — Plan of Care (Signed)
   Problem: Education: Goal: Knowledge of General Education information will improve Description: Including pain rating scale, medication(s)/side effects and non-pharmacologic comfort measures Outcome: Progressing   Problem: Health Behavior/Discharge Planning: Goal: Ability to manage health-related needs will improve Outcome: Progressing   Problem: Activity: Goal: Risk for activity intolerance will decrease Outcome: Progressing

## 2024-10-19 NOTE — Anesthesia Postprocedure Evaluation (Signed)
 Anesthesia Post Note  Patient: Mike Todd  Procedure(s) Performed: APPENDECTOMY, LAPAROSCOPIC (Abdomen)     Patient location during evaluation: PACU Anesthesia Type: General Level of consciousness: awake and alert, patient cooperative and oriented Pain management: pain level controlled Vital Signs Assessment: post-procedure vital signs reviewed and stable Respiratory status: spontaneous breathing, nonlabored ventilation and respiratory function stable Cardiovascular status: blood pressure returned to baseline and stable Postop Assessment: no apparent nausea or vomiting Anesthetic complications: no   No notable events documented.  Last Vitals:  Vitals:   10/19/24 1103 10/19/24 1115  BP: 116/62 (!) 104/59  Pulse: 98 69  Resp: 18 15  Temp: 36.6 C   SpO2: 98% 97%    Last Pain:  Vitals:   10/19/24 1115  TempSrc:   PainSc: 0-No pain                 Ia Leeb,E. Sarath Privott

## 2024-10-19 NOTE — Plan of Care (Signed)
  Problem: Education: Goal: Knowledge of General Education information will improve Description: Including pain rating scale, medication(s)/side effects and non-pharmacologic comfort measures 10/19/2024 1655 by Estelle Ashley BIRCH, RN Outcome: Progressing 10/19/2024 1517 by Estelle Ashley BIRCH, RN Outcome: Progressing   Problem: Health Behavior/Discharge Planning: Goal: Ability to manage health-related needs will improve 10/19/2024 1655 by Estelle Ashley BIRCH, RN Outcome: Progressing 10/19/2024 1517 by Estelle Ashley BIRCH, RN Outcome: Progressing   Problem: Clinical Measurements: Goal: Ability to maintain clinical measurements within normal limits will improve Outcome: Progressing Goal: Will remain free from infection Outcome: Progressing Goal: Diagnostic test results will improve Outcome: Progressing Goal: Respiratory complications will improve Outcome: Progressing Goal: Cardiovascular complication will be avoided Outcome: Progressing   Problem: Activity: Goal: Risk for activity intolerance will decrease 10/19/2024 1655 by Estelle Ashley D, RN Outcome: Progressing 10/19/2024 1517 by Estelle Ashley BIRCH, RN Outcome: Progressing   Problem: Nutrition: Goal: Adequate nutrition will be maintained Outcome: Progressing   Problem: Coping: Goal: Level of anxiety will decrease Outcome: Progressing   Problem: Elimination: Goal: Will not experience complications related to bowel motility Outcome: Progressing Goal: Will not experience complications related to urinary retention Outcome: Progressing   Problem: Pain Managment: Goal: General experience of comfort will improve and/or be controlled Outcome: Progressing   Problem: Safety: Goal: Ability to remain free from injury will improve Outcome: Progressing   Problem: Skin Integrity: Goal: Risk for impaired skin integrity will decrease Outcome: Progressing

## 2024-10-19 NOTE — H&P (Signed)
 Mike Todd is an 23 y.o. male.   Chief Complaint: Abdominal pain, nausea, vomiting HPI: This is a 23 year old male with no significant past medical or surgical history who presents with 2 days of generalized abdominal pain that has now localized to the right lower quadrant.  He has had some nausea and vomiting.  No diarrhea.  He presented to the emergency department for evaluation.  He was noted to have acute appendicitis.  His appendix is in a retrocecal location.  History reviewed. No pertinent past medical history.  History reviewed. No pertinent surgical history.  History reviewed. No pertinent family history. Social History:  has no history on file for tobacco use, alcohol use, and drug use.  Allergies: No Known Allergies  Medications Prior to Admission  Medication Sig Dispense Refill   lamoTRIgine (LAMICTAL) 25 MG tablet Take 25 mg by mouth 2 (two) times daily. (Patient not taking: Reported on 10/19/2024)     ondansetron  (ZOFRAN  ODT) 4 MG disintegrating tablet Take 1 tablet (4 mg total) by mouth every 8 (eight) hours as needed for nausea or vomiting. (Patient not taking: Reported on 10/19/2024) 10 tablet 0   venlafaxine XR (EFFEXOR-XR) 37.5 MG 24 hr capsule Take 37.5 mg by mouth daily. (Patient not taking: Reported on 10/19/2024)      Results for orders placed or performed during the hospital encounter of 10/18/24 (from the past 48 hours)  Lipase, blood     Status: None   Collection Time: 10/18/24  8:09 PM  Result Value Ref Range   Lipase 23 11 - 51 U/L    Comment: Performed at [facility], [address], [place], [place] [address]  Comprehensive metabolic panel     Status: Abnormal   Collection Time: 10/18/24  8:09 PM  Result Value Ref Range   Sodium 139 135 - 145 mmol/L   Potassium 3.4 (L) 3.5 - 5.1 mmol/L   Chloride 102 98 - 111 mmol/L   CO2 25 22 - 32 mmol/L   Glucose, Bld 129 (H) 70 - 99 mg/dL    Comment: Glucose reference range applies only to  samples taken after fasting for at least 8 hours.   BUN 17 6 - 20 mg/dL   Creatinine, Ser 9.13 0.61 - 1.24 mg/dL   Calcium 89.9 8.9 - 89.6 mg/dL   Total Protein 7.7 6.5 - 8.1 g/dL   Albumin 4.7 3.5 - 5.0 g/dL   AST 20 15 - 41 U/L   ALT 15 0 - 44 U/L   Alkaline Phosphatase 66 38 - 126 U/L   Total Bilirubin 0.4 0.0 - 1.2 mg/dL   GFR, Estimated >39 >39 mL/min    Comment: (NOTE) Calculated using the CKD-EPI Creatinine Equation (2021)    Anion gap 12 5 - 15    Comment: Performed at [facility], [address], [place], [place] [address]  CBC     Status: Abnormal   Collection Time: 10/18/24  8:09 PM  Result Value Ref Range   WBC 11.0 (H) 4.0 - 10.5 K/uL   RBC 4.12 (L) 4.22 - 5.81 MIL/uL   Hemoglobin 13.8 13.0 - 17.0 g/dL   HCT 60.7 60.9 - 47.9 %   MCV 95.1 80.0 - 100.0 fL   MCH 33.5 26.0 - 34.0 pg   MCHC 35.2 30.0 - 36.0 g/dL   RDW 88.4 88.4 - 84.4 %   Platelets 249 150 - 400 K/uL   nRBC 0.0 0.0 - 0.2 %  Comment: Performed at [facility], [address] [place], [place] [address]  Urinalysis, Routine w reflex microscopic -Urine, Clean Catch     Status: None   Collection Time: 10/18/24 10:38 PM  Result Value Ref Range   Color, Urine YELLOW YELLOW   APPearance CLEAR CLEAR   Specific Gravity, Urine 1.016 1.005 - 1.030   pH 6.0 5.0 - 8.0   Glucose, UA NEGATIVE NEGATIVE mg/dL   Hgb urine dipstick NEGATIVE NEGATIVE   Bilirubin Urine NEGATIVE NEGATIVE   Ketones, ur NEGATIVE NEGATIVE mg/dL   Protein, ur NEGATIVE NEGATIVE mg/dL   Nitrite NEGATIVE NEGATIVE   Leukocytes,Ua NEGATIVE NEGATIVE    Comment: Performed at [facility], [address] [place], [place] [address]   CT ABDOMEN PELVIS W CONTRAST Result Date: 10/18/2024 EXAM: CT ABDOMEN AND PELVIS WITH CONTRAST 10/18/2024 11:46:58 PM TECHNIQUE: CT of the abdomen and pelvis was performed with the administration of 100 mL of iohexol (OMNIPAQUE) 300 MG/ML solution.  Multiplanar reformatted images are provided for review. Automated exposure control, iterative reconstruction, and/or weight-based adjustment of the mA/kV was utilized to reduce the radiation dose to as low as reasonably achievable. COMPARISON: None available. CLINICAL HISTORY: RLQ abdominal pain. FINDINGS: LOWER CHEST: No acute abnormality. LIVER: The liver is unremarkable. GALLBLADDER AND BILE DUCTS: Gallbladder is unremarkable. No biliary ductal dilatation. SPLEEN: No acute abnormality. PANCREAS: No acute abnormality. ADRENAL GLANDS: No acute abnormality. KIDNEYS, URETERS AND BLADDER: The kidneys are normal in size and position. Tiny cortical hypodensity within the interpolar region of the left kidney is too small to accurately characterize. Per consensus, no follow-up is needed for simple Bosniak type 1 and 2 renal cysts, unless the patient has a malignancy history or risk factors. The kidneys are otherwise unremarkable. No stones in the kidneys or ureters. No hydronephrosis. No perinephric or periureteral stranding. Urinary bladder is unremarkable. GI AND BOWEL: Stomach demonstrates no acute abnormality. Moderate sigmoid diverticulosis. The small bowel and large bowel are otherwise unremarkable. The appendix is retrocecal in location and demonstrates extensive periappendiceal inflammatory stranding in keeping with changes of acute, unruptured appendicitis. No periappendiceal loculated fluid collection. There is no bowel obstruction. PERITONEUM AND RETROPERITONEUM: No ascites. No free air. VASCULATURE: Aorta is normal in caliber. LYMPH NODES: No lymphadenopathy. REPRODUCTIVE ORGANS: No acute abnormality. BONES AND SOFT TISSUES: No acute osseous abnormality. No focal soft tissue abnormality. IMPRESSION: 1. Acute, unruptured retrocecal appendicitis with extensive periappendiceal inflammatory stranding. No periappendiceal loculated fluid collection, obstruction, free air, or free fluid. 2. Moderate sigmoid  diverticulosis without evidence of diverticulitis. Electronically signed by: Dorethia Molt MD 10/18/2024 11:56 PM EST RP Workstation: [id number]    Review of Systems  HENT:  Negative for ear discharge, ear pain, hearing loss and tinnitus.   Eyes:  Negative for photophobia and pain.  Respiratory:  Negative for cough and shortness of breath.   Cardiovascular:  Negative for chest pain.  Gastrointestinal:  Positive for abdominal pain, nausea and vomiting.  Genitourinary:  Negative for dysuria, flank pain, frequency and urgency.  Musculoskeletal:  Negative for back pain, myalgias and neck pain.  Neurological:  Negative for dizziness and headaches.  Hematological:  Does not bruise/bleed easily.  Psychiatric/Behavioral:  The patient is not nervous/anxious.     Blood pressure 105/67, pulse 85, temperature 98 F (36.7 C), temperature source Oral, resp. rate 17, height 6' 4 (1.93 m), weight 68 kg, SpO2 100%. Physical Exam  Constitutional:  WDWN in NAD, conversant, no obvious deformities; lying in bed comfortably Eyes:  Pupils equal, round; sclera anicteric; moist conjunctiva; no lid lag HENT:  Oral mucosa moist; good dentition  Neck:  No masses palpated, trachea midline; no thyromegaly Lungs:  CTA bilaterally; normal respiratory effort CV:  Regular rate and rhythm; no murmurs; extremities well-perfused with no edema Abd:  +bowel sounds, soft, tender right lower quadrant no palpable organomegaly; no palpable hernias Musc:  Unable to assess gait; no apparent clubbing or cyanosis in extremities Lymphatic:  No palpable cervical or axillary lymphadenopathy Skin:  Warm, dry; no sign of jaundice Psychiatric - alert and oriented x 4; calm mood and affect  Assessment/Plan Acute appendicitis  Recommend laparoscopic appendectomy.The surgical procedure has been discussed with the patient.  Potential risks, benefits, alternative treatments, and expected outcomes have been explained.  All of the patient's  questions at this time have been answered.  The likelihood of reaching the patient's treatment goal is good.  The patient understand the proposed surgical procedure and wishes to proceed.   Donnice MARLA Lima, MD 10/19/2024, 7:36 AM

## 2024-10-20 ENCOUNTER — Encounter (HOSPITAL_COMMUNITY): Payer: Self-pay | Admitting: Surgery

## 2024-10-20 NOTE — Discharge Summary (Signed)
 Physician Discharge Summary  Patient ID: Mike Todd MRN: [id number] DOB/AGE: 21/29/2002 21 y.o.  Admit date: 10/18/2024 Discharge date: 11.16.25   Admission Diagnoses:Acute appendicitis  Discharge Diagnoses: Same Principal Problem:   Acute appendicitis   Discharged Condition: good  Hospital Course: Presented with acute appendicitis.  Was admitted to the hospital for IV antibiotics for a few hours, then underwent laparoscopic appendectomy.  He did well after surgery and is discharged home    Significant Diagnostic Studies: CT ABDOMEN PELVIS W CONTRAST Result Date: 10/18/2024 EXAM: CT ABDOMEN AND PELVIS WITH CONTRAST 10/18/2024 11:46:58 PM TECHNIQUE: CT of the abdomen and pelvis was performed with the administration of 100 mL of iohexol (OMNIPAQUE) 300 MG/ML solution. Multiplanar reformatted images are provided for review. Automated exposure control, iterative reconstruction, and/or weight-based adjustment of the mA/kV was utilized to reduce the radiation dose to as low as reasonably achievable. COMPARISON: None available. CLINICAL HISTORY: RLQ abdominal pain. FINDINGS: LOWER CHEST: No acute abnormality. LIVER: The liver is unremarkable. GALLBLADDER AND BILE DUCTS: Gallbladder is unremarkable. No biliary ductal dilatation. SPLEEN: No acute abnormality. PANCREAS: No acute abnormality. ADRENAL GLANDS: No acute abnormality. KIDNEYS, URETERS AND BLADDER: The kidneys are normal in size and position. Tiny cortical hypodensity within the interpolar region of the left kidney is too small to accurately characterize. Per consensus, no follow-up is needed for simple Bosniak type 1 and 2 renal cysts, unless the patient has a malignancy history or risk factors. The kidneys are otherwise unremarkable. No stones in the kidneys or ureters. No hydronephrosis. No perinephric or periureteral stranding. Urinary bladder is unremarkable. GI AND BOWEL: Stomach demonstrates no acute abnormality. Moderate sigmoid  diverticulosis. The small bowel and large bowel are otherwise unremarkable. The appendix is retrocecal in location and demonstrates extensive periappendiceal inflammatory stranding in keeping with changes of acute, unruptured appendicitis. No periappendiceal loculated fluid collection. There is no bowel obstruction. PERITONEUM AND RETROPERITONEUM: No ascites. No free air. VASCULATURE: Aorta is normal in caliber. LYMPH NODES: No lymphadenopathy. REPRODUCTIVE ORGANS: No acute abnormality. BONES AND SOFT TISSUES: No acute osseous abnormality. No focal soft tissue abnormality. IMPRESSION: 1. Acute, unruptured retrocecal appendicitis with extensive periappendiceal inflammatory stranding. No periappendiceal loculated fluid collection, obstruction, free air, or free fluid. 2. Moderate sigmoid diverticulosis without evidence of diverticulitis. Electronically signed by: Dorethia Molt MD 10/18/2024 11:56 PM EST RP Workstation: [id number]     Treatments: surgery: laparoscopic appendectomy  Discharge Exam: Blood pressure 117/66, pulse 68, temperature 98.5 F (36.9 C), temperature source Oral, resp. rate 18, height 6' 4 (1.93 m), weight 68 kg, SpO2 100%. Incision/Wound: Incisional tenderness;  dressings dry  Disposition: Discharge disposition: 01-Home or Self Care       Discharge Instructions     Call MD for:  persistant nausea and vomiting   Complete by: As directed    Call MD for:  redness, tenderness, or signs of infection (pain, swelling, redness, odor or green/yellow discharge around incision site)   Complete by: As directed    Call MD for:  severe uncontrolled pain   Complete by: As directed    Call MD for:  temperature >100.4   Complete by: As directed    Diet general   Complete by: As directed    Driving Restrictions   Complete by: As directed    Do not drive while taking pain medications   Increase activity slowly   Complete by: As directed    May shower / Bathe   Complete by: As  directed  Allergies as of 10/19/2024   No Known Allergies      Medication List     TAKE these medications    lamoTRIgine 25 MG tablet Commonly known as: LAMICTAL Take 25 mg by mouth 2 (two) times daily.   ondansetron  4 MG disintegrating tablet Commonly known as: Zofran  ODT Take 1 tablet (4 mg total) by mouth every 8 (eight) hours as needed for nausea or vomiting.   oxyCODONE 5 MG immediate release tablet Commonly known as: Oxy IR/ROXICODONE Take 1 tablet (5 mg total) by mouth every 6 (six) hours as needed for severe pain (pain score 7-10).   venlafaxine XR 37.5 MG 24 hr capsule Commonly known as: EFFEXOR-XR Take 37.5 mg by mouth daily.        Follow-up Information     Maczis, Puja Gosai, PA-C. Schedule an appointment as soon as possible for a visit in 3 week(s).   Specialty: General Surgery Why: Call the office during business hours to schedule a follow-up appointment in 2-3 weeks. Contact information: [address] [facility] [place] [address] [phone]                 Signed: Donnice MARLA Lima 10/20/2024, 11:05 AM

## 2024-10-21 LAB — MISC LABCORP TEST (SEND OUT): Labcorp test code: 83935

## 2024-10-21 LAB — NASOPHARYNGEAL CULTURE: Special Requests: NORMAL

## 2024-10-24 LAB — SURGICAL PATHOLOGY
